# Patient Record
Sex: Female | Born: 1955 | Race: White | Hispanic: No | Marital: Married | Smoking: Former smoker
Health system: Southern US, Community
[De-identification: ages and names within clinical notes are randomized; demographics above are authoritative.]

## PROBLEM LIST (undated history)

## (undated) DIAGNOSIS — F419 Anxiety disorder, unspecified: Secondary | ICD-10-CM

## (undated) DIAGNOSIS — J45909 Unspecified asthma, uncomplicated: Secondary | ICD-10-CM

## (undated) DIAGNOSIS — E785 Hyperlipidemia, unspecified: Secondary | ICD-10-CM

## (undated) DIAGNOSIS — J449 Chronic obstructive pulmonary disease, unspecified: Secondary | ICD-10-CM

## (undated) DIAGNOSIS — E210 Primary hyperparathyroidism: Secondary | ICD-10-CM

## (undated) DIAGNOSIS — M7050 Other bursitis of knee, unspecified knee: Secondary | ICD-10-CM

## (undated) DIAGNOSIS — I10 Essential (primary) hypertension: Secondary | ICD-10-CM

## (undated) HISTORY — PX: ABDOMINAL HYSTERECTOMY: SHX81

---

## 1998-07-24 HISTORY — PX: SPLENECTOMY: SUR1306

## 1998-07-24 HISTORY — PX: CHOLECYSTECTOMY: SHX55

## 2004-06-21 ENCOUNTER — Ambulatory Visit: Payer: Self-pay | Admitting: Obstetrics and Gynecology

## 2005-11-10 ENCOUNTER — Ambulatory Visit: Payer: Self-pay | Admitting: Obstetrics and Gynecology

## 2005-11-15 ENCOUNTER — Ambulatory Visit: Payer: Self-pay | Admitting: Obstetrics and Gynecology

## 2005-12-22 ENCOUNTER — Emergency Department: Payer: Self-pay | Admitting: Emergency Medicine

## 2006-07-27 ENCOUNTER — Ambulatory Visit: Payer: Self-pay | Admitting: Obstetrics and Gynecology

## 2007-07-04 ENCOUNTER — Ambulatory Visit: Payer: Self-pay | Admitting: Obstetrics and Gynecology

## 2008-04-05 ENCOUNTER — Emergency Department: Payer: Self-pay | Admitting: Unknown Physician Specialty

## 2008-07-31 ENCOUNTER — Ambulatory Visit: Payer: Self-pay | Admitting: Obstetrics and Gynecology

## 2008-09-09 ENCOUNTER — Ambulatory Visit: Payer: Self-pay | Admitting: Unknown Physician Specialty

## 2009-04-09 ENCOUNTER — Emergency Department: Payer: Self-pay | Admitting: Emergency Medicine

## 2009-08-20 ENCOUNTER — Ambulatory Visit: Payer: Self-pay | Admitting: Obstetrics and Gynecology

## 2010-10-11 ENCOUNTER — Ambulatory Visit: Payer: Self-pay | Admitting: Unknown Physician Specialty

## 2011-06-14 ENCOUNTER — Ambulatory Visit: Payer: Self-pay | Admitting: Unknown Physician Specialty

## 2011-07-03 ENCOUNTER — Ambulatory Visit: Payer: Self-pay | Admitting: Unknown Physician Specialty

## 2011-07-10 ENCOUNTER — Ambulatory Visit: Payer: Self-pay | Admitting: Unknown Physician Specialty

## 2011-08-04 ENCOUNTER — Ambulatory Visit: Payer: Self-pay | Admitting: Unknown Physician Specialty

## 2011-10-12 ENCOUNTER — Emergency Department: Payer: Self-pay | Admitting: Emergency Medicine

## 2011-10-12 LAB — COMPREHENSIVE METABOLIC PANEL
Albumin: 4.5 g/dL (ref 3.4–5.0)
Alkaline Phosphatase: 90 U/L (ref 50–136)
Anion Gap: 12 (ref 7–16)
Calcium, Total: 10.3 mg/dL — ABNORMAL HIGH (ref 8.5–10.1)
Chloride: 106 mmol/L (ref 98–107)
EGFR (African American): >60
EGFR (Non-African Amer.): >60
Glucose: 110 mg/dL — ABNORMAL HIGH (ref 65–99)
SGOT(AST): 23 U/L (ref 15–37)
SGPT (ALT): 43 U/L

## 2011-10-12 LAB — CBC
HCT: 43.6 % (ref 35.0–47.0)
MCHC: 33.3 g/dL (ref 32.0–36.0)
MCV: 100 fL (ref 80–100)
RDW: 14.6 % — ABNORMAL HIGH (ref 11.5–14.5)
WBC: 9.6 10*3/uL (ref 3.6–11.0)

## 2011-10-12 LAB — URINALYSIS, COMPLETE
Bacteria: NONE SEEN
Glucose,UR: NEGATIVE mg/dL (ref 0–75)
Nitrite: NEGATIVE
Protein: NEGATIVE
WBC UR: 3 /HPF (ref 0–5)

## 2015-07-07 ENCOUNTER — Other Ambulatory Visit: Payer: Self-pay | Admitting: Family Medicine

## 2015-07-07 DIAGNOSIS — Z1231 Encounter for screening mammogram for malignant neoplasm of breast: Secondary | ICD-10-CM

## 2015-07-08 ENCOUNTER — Ambulatory Visit
Admission: RE | Admit: 2015-07-08 | Discharge: 2015-07-08 | Disposition: A | Discharge status: Home / Self Care | Payer: BLUE CROSS/BLUE SHIELD | Source: Ambulatory Visit | Attending: Family Medicine | Admitting: Family Medicine

## 2015-07-08 DIAGNOSIS — Z1231 Encounter for screening mammogram for malignant neoplasm of breast: Secondary | ICD-10-CM | POA: Insufficient documentation

## 2016-12-25 ENCOUNTER — Other Ambulatory Visit: Payer: Self-pay | Admitting: Family Medicine

## 2016-12-25 DIAGNOSIS — Z1231 Encounter for screening mammogram for malignant neoplasm of breast: Secondary | ICD-10-CM

## 2017-01-01 ENCOUNTER — Ambulatory Visit
Admission: RE | Admit: 2017-01-01 | Discharge: 2017-01-01 | Disposition: A | Discharge status: Home / Self Care | Payer: BLUE CROSS/BLUE SHIELD | Source: Ambulatory Visit | Attending: Family Medicine | Admitting: Family Medicine

## 2017-01-01 DIAGNOSIS — Z1231 Encounter for screening mammogram for malignant neoplasm of breast: Secondary | ICD-10-CM | POA: Insufficient documentation

## 2017-01-02 ENCOUNTER — Encounter: Payer: Self-pay | Admitting: Obstetrics & Gynecology

## 2017-01-02 ENCOUNTER — Ambulatory Visit (INDEPENDENT_AMBULATORY_CARE_PROVIDER_SITE_OTHER): Payer: BLUE CROSS/BLUE SHIELD | Admitting: Obstetrics & Gynecology

## 2017-01-02 VITALS — BP 120/80 | HR 79 | Ht 67.0 in | Wt 197.0 lb

## 2017-01-02 DIAGNOSIS — N3000 Acute cystitis without hematuria: Secondary | ICD-10-CM | POA: Diagnosis not present

## 2017-01-02 DIAGNOSIS — R3915 Urgency of urination: Secondary | ICD-10-CM

## 2017-01-02 LAB — POCT URINALYSIS DIPSTICK
Bilirubin, UA: NEGATIVE
Glucose, UA: NEGATIVE
Ketones, UA: NEGATIVE
Nitrite, UA: NEGATIVE
PH UA: 6 (ref 5.0–8.0)
PROTEIN UA: NEGATIVE
RBC UA: NEGATIVE
Spec Grav, UA: 1.015 (ref 1.010–1.025)
Urobilinogen, UA: NEGATIVE E.U./dL — AB

## 2017-01-02 MED ORDER — SULFAMETHOXAZOLE-TRIMETHOPRIM 800-160 MG PO TABS: 1.0000 | ORAL_TABLET | Freq: Two times a day (BID) | ORAL | 1 refills | Status: DC

## 2017-01-02 NOTE — Patient Instructions (Signed)
Sulfamethoxazole; Trimethoprim, SMX-TMP tablets What is this medicine? SULFAMETHOXAZOLE; TRIMETHOPRIM or SMX-TMP (suhl fuh meth OK suh zohl; trye METH oh prim) is a combination of a sulfonamide antibiotic and a second antibiotic, trimethoprim. It is used to treat or prevent certain kinds of bacterial infections. It will not work for colds, flu, or other viral infections. This medicine may be used for other purposes; ask your health care provider or pharmacist if you have questions. COMMON BRAND NAME(S): Bacter-Aid DS, Bactrim, Bactrim DS, Septra, Septra DS What should I tell my health care provider before I take this medicine? They need to know if you have any of these conditions: -anemia -asthma -being treated with anticonvulsants -if you frequently drink alcohol containing drinks -kidney disease -liver disease -low level of folic acid or glucose-6-phosphate dehydrogenase -poor nutrition or malabsorption -porphyria -severe allergies -thyroid disorder -an unusual or allergic reaction to sulfamethoxazole, trimethoprim, sulfa drugs, other medicines, foods, dyes, or preservatives -pregnant or trying to get pregnant -breast-feeding How should I use this medicine? Take this medicine by mouth with a full glass of water. Follow the directions on the prescription label. Take your medicine at regular intervals. Do not take it more often than directed. Do not skip doses or stop your medicine early. Talk to your pediatrician regarding the use of this medicine in children. Special care may be needed. This medicine has been used in children as young as 2 months of age. Overdosage: If you think you have taken too much of this medicine contact a poison control center or emergency room at once. NOTE: This medicine is only for you. Do not share this medicine with others. What if I miss a dose? If you miss a dose, take it as soon as you can. If it is almost time for your next dose, take only that dose. Do  not take double or extra doses. What may interact with this medicine? Do not take this medicine with any of the following medications: -aminobenzoate potassium -dofetilide -metronidazole This medicine may also interact with the following medications: -ACE inhibitors like benazepril, enalapril, lisinopril, and ramipril -birth control pills -cyclosporine -digoxin -diuretics -indomethacin -medicines for diabetes -methenamine -methotrexate -phenytoin -potassium supplements -pyrimethamine -sulfinpyrazone -tricyclic antidepressants -warfarin This list may not describe all possible interactions. Give your health care provider a list of all the medicines, herbs, non-prescription drugs, or dietary supplements you use. Also tell them if you smoke, drink alcohol, or use illegal drugs. Some items may interact with your medicine. What should I watch for while using this medicine? Tell your doctor or health care professional if your symptoms do not improve. Drink several glasses of water a day to reduce the risk of kidney problems. Do not treat diarrhea with over the counter products. Contact your doctor if you have diarrhea that lasts more than 2 days or if it is severe and watery. This medicine can make you more sensitive to the sun. Keep out of the sun. If you cannot avoid being in the sun, wear protective clothing and use a sunscreen. Do not use sun lamps or tanning beds/booths. What side effects may I notice from receiving this medicine? Side effects that you should report to your doctor or health care professional as soon as possible: -allergic reactions like skin rash or hives, swelling of the face, lips, or tongue -breathing problems -fever or chills, sore throat -irregular heartbeat, chest pain -joint or muscle pain -pain or difficulty passing urine -red pinpoint spots on skin -redness, blistering, peeling or loosening of   the skin, including inside the mouth -unusual bleeding or  bruising -unusually weak or tired -yellowing of the eyes or skin Side effects that usually do not require medical attention (report to your doctor or health care professional if they continue or are bothersome): -diarrhea -dizziness -headache -loss of appetite -nausea, vomiting -nervousness This list may not describe all possible side effects. Call your doctor for medical advice about side effects. You may report side effects to FDA at 1-800-FDA-1088. Where should I keep my medicine? Keep out of the reach of children. Store at room temperature between 20 to 25 degrees C (68 to 77 degrees F). Protect from light. Throw away any unused medicine after the expiration date. NOTE: This sheet is a summary. It may not cover all possible information. If you have questions about this medicine, talk to your doctor, pharmacist, or health care provider.  2018 Elsevier/Gold Standard (2013-02-14 14:38:26)  

## 2017-01-02 NOTE — Progress Notes (Signed)
  HPI:      [name] is a 61 y.o. [phone] who LMP was No LMP recorded. Patient is postmenopausal., presents today for a problem visit.    Urinary Tract Infection: Patient complains of urgency . She has had symptoms for 1 day. Patient also complains of frequency. Patient denies back pain and fever, nocturia, incontnence, dysuria. Patient does not have a history of recurrent UTI.  Patient does not have a history of pyelonephritis. H/o stones  PMHx: She  has no past medical history on file. Also,  has a past surgical history that includes Abdominal hysterectomy., family history is not on file.,  reports that she has never smoked. She has never used smokeless tobacco. She reports that she does not drink alcohol or use drugs.  She has a current medication list which includes the following prescription(s): sulfamethoxazole-trimethoprim. Also, has No Known Allergies.  Review of Systems  All other systems reviewed and are negative.   Objective: BP 120/80   Pulse 79   Ht 5\' 7"  (1.702 m)   Wt 197 lb (89.4 kg)   BMI 30.85 kg/m  Physical Exam  Constitutional: She is oriented to person, place, and time. She appears well-developed and well-nourished. No distress.  Musculoskeletal: Normal range of motion.  Neurological: She is alert and oriented to person, place, and time.  Skin: Skin is warm and dry.  Psychiatric: She has a normal mood and affect.  Vitals reviewed.  Results for orders placed or performed in visit on 01/02/17  POCT urinalysis dipstick  Result Value Ref Range   Color, UA yellow    Clarity, UA clear    Glucose, UA neg    Bilirubin, UA neg    Ketones, UA neg    Spec Grav, UA 1.015 1.010 - 1.025   Blood, UA neg    pH, UA 6.0 5.0 - 8.0   Protein, UA neg    Urobilinogen, UA negative (A) 0.2 or 1.0 E.U./dL   Nitrite, UA neg    Leukocytes, UA Moderate (2+) (A) Negative    ASSESSMENT/PLAN:   Acute cystitis  Problem List Items Addressed This Visit      Other   Urinary urgency - Primary   Relevant Orders   POCT urinalysis dipstick (Completed)   Urine Culture    Other Visit Diagnoses    Acute cystitis without hematuria       Relevant Orders   Urine Culture    Bactrim  Barnett Applebaum, MD, [facility], [facility] 01/02/2017  2:34 PM

## 2017-01-04 LAB — URINE CULTURE

## 2018-04-02 ENCOUNTER — Encounter: Payer: Self-pay | Admitting: Internal Medicine

## 2018-04-02 ENCOUNTER — Inpatient Hospital Stay: Payer: BLUE CROSS/BLUE SHIELD | Attending: Internal Medicine | Admitting: Internal Medicine

## 2018-04-02 DIAGNOSIS — D75839 Thrombocytosis, unspecified: Secondary | ICD-10-CM | POA: Insufficient documentation

## 2018-04-02 DIAGNOSIS — Z7901 Long term (current) use of anticoagulants: Secondary | ICD-10-CM | POA: Diagnosis not present

## 2018-04-02 DIAGNOSIS — D473 Essential (hemorrhagic) thrombocythemia: Secondary | ICD-10-CM | POA: Diagnosis not present

## 2018-04-02 DIAGNOSIS — I824Z3 Acute embolism and thrombosis of unspecified deep veins of distal lower extremity, bilateral: Secondary | ICD-10-CM | POA: Diagnosis not present

## 2018-04-02 DIAGNOSIS — Z9081 Acquired absence of spleen: Secondary | ICD-10-CM | POA: Insufficient documentation

## 2018-04-02 DIAGNOSIS — Z87891 Personal history of nicotine dependence: Secondary | ICD-10-CM

## 2018-04-02 DIAGNOSIS — Z862 Personal history of diseases of the blood and blood-forming organs and certain disorders involving the immune mechanism: Secondary | ICD-10-CM | POA: Diagnosis not present

## 2018-04-02 DIAGNOSIS — Z993 Dependence on wheelchair: Secondary | ICD-10-CM | POA: Insufficient documentation

## 2018-04-02 MED ORDER — RIVAROXABAN 20 MG PO TABS: 20.0000 mg | ORAL_TABLET | Freq: Every day | ORAL | 0 refills | Status: DC
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
## 2018-04-19 ENCOUNTER — Ambulatory Visit
Admission: EM | Admit: 2018-04-19 | Discharge: 2018-04-19 | Disposition: A | Discharge status: Home / Self Care | Payer: BLUE CROSS/BLUE SHIELD | Attending: Family Medicine | Admitting: Family Medicine

## 2018-04-19 ENCOUNTER — Other Ambulatory Visit: Payer: Self-pay

## 2018-04-19 DIAGNOSIS — T8189XA Other complications of procedures, not elsewhere classified, initial encounter: Secondary | ICD-10-CM | POA: Diagnosis not present

## 2018-04-19 DIAGNOSIS — T148XXD Other injury of unspecified body region, subsequent encounter: Secondary | ICD-10-CM | POA: Diagnosis not present

## 2018-04-19 DIAGNOSIS — L089 Local infection of the skin and subcutaneous tissue, unspecified: Secondary | ICD-10-CM

## 2018-04-19 DIAGNOSIS — T148XXA Other injury of unspecified body region, initial encounter: Principal | ICD-10-CM

## 2018-04-19 DIAGNOSIS — Z189 Retained foreign body fragments, unspecified material: Secondary | ICD-10-CM

## 2018-04-19 DIAGNOSIS — Z1889 Other specified retained foreign body fragments: Secondary | ICD-10-CM

## 2018-04-19 MED ORDER — DOXYCYCLINE HYCLATE 100 MG PO CAPS: 100.0000 mg | ORAL_CAPSULE | Freq: Two times a day (BID) | ORAL | 0 refills | Status: AC
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
## 2018-04-19 NOTE — Discharge Instructions (Signed)
Call Surgeon to let him know.  Medication as prescribed.  Take care  Dr. Lacinda Axon
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
## 2018-05-30 ENCOUNTER — Other Ambulatory Visit: Payer: Self-pay | Admitting: Internal Medicine

## 2018-06-04 ENCOUNTER — Ambulatory Visit: Payer: BLUE CROSS/BLUE SHIELD | Admitting: Internal Medicine

## 2018-06-04 ENCOUNTER — Other Ambulatory Visit: Payer: BLUE CROSS/BLUE SHIELD

## 2018-09-05 ENCOUNTER — Other Ambulatory Visit: Payer: Self-pay | Admitting: Family Medicine

## 2018-09-05 DIAGNOSIS — Z1231 Encounter for screening mammogram for malignant neoplasm of breast: Secondary | ICD-10-CM

## 2018-09-25 ENCOUNTER — Ambulatory Visit
Admission: RE | Admit: 2018-09-25 | Discharge: 2018-09-25 | Disposition: A | Discharge status: Home / Self Care | Payer: BLUE CROSS/BLUE SHIELD | Source: Ambulatory Visit | Attending: Family Medicine | Admitting: Family Medicine

## 2018-09-25 DIAGNOSIS — Z1231 Encounter for screening mammogram for malignant neoplasm of breast: Secondary | ICD-10-CM | POA: Insufficient documentation

## 2018-09-30 ENCOUNTER — Other Ambulatory Visit: Payer: Self-pay | Admitting: Family Medicine

## 2018-09-30 DIAGNOSIS — R928 Other abnormal and inconclusive findings on diagnostic imaging of breast: Secondary | ICD-10-CM

## 2018-09-30 DIAGNOSIS — N6310 Unspecified lump in the right breast, unspecified quadrant: Secondary | ICD-10-CM

## 2018-10-07 ENCOUNTER — Other Ambulatory Visit: Payer: Self-pay

## 2018-10-07 ENCOUNTER — Ambulatory Visit
Admission: RE | Admit: 2018-10-07 | Discharge: 2018-10-07 | Disposition: A | Discharge status: Home / Self Care | Payer: BLUE CROSS/BLUE SHIELD | Source: Ambulatory Visit | Attending: Family Medicine | Admitting: Family Medicine

## 2018-10-07 DIAGNOSIS — R928 Other abnormal and inconclusive findings on diagnostic imaging of breast: Secondary | ICD-10-CM

## 2018-10-07 DIAGNOSIS — N6310 Unspecified lump in the right breast, unspecified quadrant: Secondary | ICD-10-CM | POA: Insufficient documentation

## 2019-06-10 ENCOUNTER — Other Ambulatory Visit
Admission: RE | Admit: 2019-06-10 | Discharge: 2019-06-10 | Disposition: A | Discharge status: Home / Self Care | Payer: BLUE CROSS/BLUE SHIELD | Source: Ambulatory Visit | Attending: General Surgery | Admitting: General Surgery

## 2019-06-10 ENCOUNTER — Other Ambulatory Visit: Payer: Self-pay

## 2019-06-10 DIAGNOSIS — Z01812 Encounter for preprocedural laboratory examination: Secondary | ICD-10-CM | POA: Diagnosis not present

## 2019-06-10 DIAGNOSIS — Z20828 Contact with and (suspected) exposure to other viral communicable diseases: Secondary | ICD-10-CM | POA: Insufficient documentation

## 2019-06-10 LAB — SARS CORONAVIRUS 2 (TAT 6-24 HRS): SARS Coronavirus 2: NEGATIVE

## 2019-06-11 ENCOUNTER — Encounter: Payer: Self-pay | Admitting: *Deleted

## 2019-06-12 ENCOUNTER — Encounter: Payer: Self-pay | Admitting: *Deleted

## 2019-06-12 ENCOUNTER — Ambulatory Visit: Payer: BLUE CROSS/BLUE SHIELD | Admitting: Certified Registered Nurse Anesthetist

## 2019-06-12 ENCOUNTER — Ambulatory Visit
Admission: RE | Admit: 2019-06-12 | Discharge: 2019-06-12 | Disposition: A | Discharge status: Home / Self Care | Payer: BLUE CROSS/BLUE SHIELD | Attending: Internal Medicine | Admitting: Internal Medicine

## 2019-06-12 ENCOUNTER — Encounter
Admission: RE | Disposition: A | Discharge status: Home / Self Care | Payer: Self-pay | Source: Home / Self Care | Attending: Internal Medicine

## 2019-06-12 ENCOUNTER — Other Ambulatory Visit: Payer: Self-pay

## 2019-06-12 DIAGNOSIS — J449 Chronic obstructive pulmonary disease, unspecified: Secondary | ICD-10-CM | POA: Insufficient documentation

## 2019-06-12 DIAGNOSIS — Z1211 Encounter for screening for malignant neoplasm of colon: Secondary | ICD-10-CM | POA: Insufficient documentation

## 2019-06-12 DIAGNOSIS — Z885 Allergy status to narcotic agent status: Secondary | ICD-10-CM | POA: Diagnosis not present

## 2019-06-12 DIAGNOSIS — E785 Hyperlipidemia, unspecified: Secondary | ICD-10-CM | POA: Insufficient documentation

## 2019-06-12 DIAGNOSIS — I10 Essential (primary) hypertension: Secondary | ICD-10-CM | POA: Insufficient documentation

## 2019-06-12 DIAGNOSIS — K5730 Diverticulosis of large intestine without perforation or abscess without bleeding: Secondary | ICD-10-CM | POA: Insufficient documentation

## 2019-06-12 DIAGNOSIS — K640 First degree hemorrhoids: Secondary | ICD-10-CM | POA: Diagnosis not present

## 2019-06-12 DIAGNOSIS — Z86010 Personal history of colonic polyps: Secondary | ICD-10-CM | POA: Diagnosis not present

## 2019-06-12 DIAGNOSIS — Z7951 Long term (current) use of inhaled steroids: Secondary | ICD-10-CM | POA: Insufficient documentation

## 2019-06-12 DIAGNOSIS — Z79899 Other long term (current) drug therapy: Secondary | ICD-10-CM | POA: Diagnosis not present

## 2019-06-12 HISTORY — DX: Anxiety disorder, unspecified: F41.9

## 2019-06-12 HISTORY — DX: Chronic obstructive pulmonary disease, unspecified: J44.9

## 2019-06-12 HISTORY — DX: Essential (primary) hypertension: I10

## 2019-06-12 HISTORY — DX: Primary hyperparathyroidism: E21.0

## 2019-06-12 HISTORY — DX: Other bursitis of knee, unspecified knee: M70.50

## 2019-06-12 HISTORY — DX: Unspecified asthma, uncomplicated: J45.909

## 2019-06-12 HISTORY — DX: Hyperlipidemia, unspecified: E78.5

## 2019-06-12 HISTORY — PX: COLONOSCOPY WITH PROPOFOL: SHX5780

## 2019-06-12 SURGERY — COLONOSCOPY WITH PROPOFOL
Anesthesia: General

## 2019-06-12 MED ORDER — PROPOFOL 10 MG/ML IV BOLUS
INTRAVENOUS | Status: DC | PRN
  Administered 2019-06-12: 60 mg via INTRAVENOUS

## 2019-06-12 MED ORDER — LIDOCAINE HCL (CARDIAC) PF 100 MG/5ML IV SOSY
PREFILLED_SYRINGE | INTRAVENOUS | Status: DC | PRN
  Administered 2019-06-12: 100 mg via INTRAVENOUS

## 2019-06-12 MED ORDER — SODIUM CHLORIDE 0.9 % IV SOLN
INTRAVENOUS | Status: DC
  Administered 2019-06-12: 1000 mL via INTRAVENOUS

## 2019-06-12 MED ORDER — PROPOFOL 500 MG/50ML IV EMUL
INTRAVENOUS | Status: DC | PRN
  Administered 2019-06-12: 125 ug/kg/min via INTRAVENOUS

## 2019-06-12 NOTE — Op Note (Signed)
[facility] Gastroenterology Patient Name: Kellie Myers Procedure Date: 06/12/2019 11:03 AM MRN: [id number] Account #: [id number] Date of Birth: 15-May-1956 Admit Type: Outpatient Age: 63 Room: [facility] ENDO ROOM 3 Gender: Female Note Status: Finalized Procedure:             Colonoscopy Indications:           High risk colon cancer surveillance: Personal history                         of colonic polyps Providers:             Benay Pike. Dany Walther MD, MD Medicines:             Propofol per Anesthesia Complications:         No immediate complications. Procedure:             Pre-Anesthesia Assessment:                        - The risks and benefits of the procedure and the                         sedation options and risks were discussed with the                         patient. All questions were answered and informed                         consent was obtained.                        - Patient identification and proposed procedure were                         verified prior to the procedure by the nurse. The                         procedure was verified in the procedure room.                        - ASA Grade Assessment: III - A patient with severe                         systemic disease.                        - After reviewing the risks and benefits, the patient                         was deemed in satisfactory condition to undergo the                         procedure.                        After obtaining informed consent, the colonoscope was                         passed under direct vision. Throughout the procedure,  the patient's blood pressure, pulse, and oxygen                         saturations were monitored continuously. The                         Colonoscope was introduced through the anus and                         advanced to the the cecum, identified by appendiceal                         orifice and ileocecal valve. The  colonoscopy was                         performed without difficulty. The patient tolerated                         the procedure well. The quality of the bowel                         preparation was good. The ileocecal valve, appendiceal                         orifice, and rectum were photographed. Findings:      The perianal and digital rectal examinations were normal. Pertinent       negatives include normal sphincter tone and no palpable rectal lesions.      Many small-mouthed diverticula were found in the entire colon.      Non-bleeding internal hemorrhoids were found during retroflexion. The       hemorrhoids were Grade I (internal hemorrhoids that do not prolapse).      The exam was otherwise without abnormality. Impression:            - Diverticulosis in the entire examined colon.                        - Non-bleeding internal hemorrhoids.                        - The examination was otherwise normal.                        - No specimens collected. Recommendation:        - Patient has a contact number available for                         emergencies. The signs and symptoms of potential                         delayed complications were discussed with the patient.                         Return to normal activities tomorrow. Written                         discharge instructions were provided to the patient.                        -  Resume previous diet.                        - Continue present medications.                        - Repeat colonoscopy in 10 years for screening                         purposes.                        - Return to GI office PRN.                        - The findings and recommendations were discussed with                         the patient. Procedure Code(s):     --- Professional ---                        [id number], Colorectal cancer screening; colonoscopy on                         individual at high risk Diagnosis Code(s):     --- Professional  ---                        K57.30, Diverticulosis of large intestine without                         perforation or abscess without bleeding                        K64.0, First degree hemorrhoids CPT copyright 2019 American Medical Association. All rights reserved. The codes documented in this report are preliminary and upon coder review may  be revised to meet current compliance requirements. Efrain Sella MD, MD 06/12/2019 11:45:45 AM This report has been signed electronically. Number of Addenda: 0 Note Initiated On: 06/12/2019 11:03 AM Scope Withdrawal Time: 0 hours 8 minutes 41 seconds  Total Procedure Duration: 0 hours 18 minutes 23 seconds  Estimated Blood Loss:  Estimated blood loss: none.      [facility]

## 2019-06-12 NOTE — Anesthesia Preprocedure Evaluation (Signed)
Anesthesia Evaluation  Patient identified by MRN, date of birth, ID band Patient awake    Reviewed: Allergy & Precautions, H&P , NPO status , Patient's Chart, lab work & pertinent test results, reviewed documented beta blocker date and time   History of Anesthesia Complications Negative for: history of anesthetic complications  Airway Mallampati: II  TM Distance: >3 FB Neck ROM: full    Dental  (+) Dental Advidsory Given, Teeth Intact, Edentulous Upper, Upper Dentures, Caps   Pulmonary neg shortness of breath, asthma , COPD,  COPD inhaler, neg recent URI, former smoker,    Pulmonary exam normal        Cardiovascular Exercise Tolerance: Good negative cardio ROS Normal cardiovascular exam     Neuro/Psych PSYCHIATRIC DISORDERS Anxiety negative neurological ROS     GI/Hepatic negative GI ROS, Neg liver ROS,   Endo/Other  negative endocrine ROS  Renal/GU negative Renal ROS  negative genitourinary   Musculoskeletal   Abdominal   Peds  Hematology negative hematology ROS (+)   Anesthesia Other Findings Past Medical History: No date: Anxiety No date: Asthma No date: COPD (chronic obstructive pulmonary disease) (HCC) No date: Hyperlipidemia No date: Hypertension No date: Pes anserine bursitis No date: Primary hyperparathyroidism (HCC)   Reproductive/Obstetrics negative OB ROS                             Anesthesia Physical Anesthesia Plan  ASA: II  Anesthesia Plan: General   Post-op Pain Management:    Induction: Intravenous  PONV Risk Score and Plan: 3 and Propofol infusion and TIVA  Airway Management Planned: Natural Airway and Nasal Cannula  Additional Equipment:   Intra-op Plan:   Post-operative Plan:   Informed Consent: I have reviewed the patients History and Physical, chart, labs and discussed the procedure including the risks, benefits and alternatives for the proposed  anesthesia with the patient or authorized representative who has indicated his/her understanding and acceptance.     Dental Advisory Given  Plan Discussed with: Anesthesiologist, CRNA and Surgeon  Anesthesia Plan Comments:         Anesthesia Quick Evaluation

## 2019-06-12 NOTE — Anesthesia Postprocedure Evaluation (Signed)
Anesthesia Post Note  Patient: Kellie Myers  Procedure(s) Performed: COLONOSCOPY WITH PROPOFOL (N/A )  Patient location during evaluation: Endoscopy Anesthesia Type: General Level of consciousness: awake and alert Pain management: pain level controlled Vital Signs Assessment: post-procedure vital signs reviewed and stable Respiratory status: spontaneous breathing, nonlabored ventilation, respiratory function stable and patient connected to nasal cannula oxygen Cardiovascular status: blood pressure returned to baseline and stable Postop Assessment: no apparent nausea or vomiting Anesthetic complications: no     Last Vitals:  Vitals:   06/12/19 1206 06/12/19 1216  BP: 121/72 122/77  Pulse: 74 71  Resp: 17 11  Temp:    SpO2: 100% 100%    Last Pain:  Vitals:   06/12/19 1216  TempSrc:   PainSc: 0-No pain                 Martha Clan

## 2019-06-12 NOTE — Interval H&P Note (Signed)
History and Physical Interval Note:  06/12/2019 11:01 AM  Kellie Myers  has presented today for surgery, with the diagnosis of COLON CANCER SCREENING.  The various methods of treatment have been discussed with the patient and family. After consideration of risks, benefits and other options for treatment, the patient has consented to  Procedure(s): COLONOSCOPY WITH PROPOFOL (N/A) as a surgical intervention.  The patient's history has been reviewed, patient examined, no change in status, stable for surgery.  I have reviewed the patient's chart and labs.  Questions were answered to the patient's satisfaction.     [place], [place]

## 2019-06-12 NOTE — H&P (Signed)
Outpatient short stay form Pre-procedure 06/12/2019 11:01 AM  K. Alice Reichert, M.D.  Primary Physician: Thereasa Distance, M.D.  Reason for visit:  Colon cancer screening  History of present illness:  Patient presents for colonoscopy for colon cancer screening. The patient denies complaints of abdominal pain, significant change in bowel habits, or rectal bleeding.     Current Facility-Administered Medications:  .  0.9 %  sodium chloride infusion, , Intravenous, Continuous, [place], Benay Pike, MD, Last Rate: 20 mL/hr at 06/12/19 1055, 1,000 mL at 06/12/19 1055  Medications Prior to Admission  Medication Sig Dispense Refill Last Dose  . albuterol (VENTOLIN HFA) 108 (90 Base) MCG/ACT inhaler Inhale 2 puffs into the lungs every 6 (six) hours as needed for wheezing or shortness of breath.     Marland Kitchen b complex vitamins tablet Take 1 tablet by mouth daily.     . fluticasone furoate-vilanterol (BREO ELLIPTA) 200-25 MCG/INH AEPB inhale 1 puff once daily   06/12/2019 at Unknown time  . calcium carbonate (OS-CAL - DOSED IN MG OF ELEMENTAL CALCIUM) 1250 (500 Ca) MG tablet Take 1 tablet by mouth.     . cholecalciferol (VITAMIN D) 1000 units tablet Take 1,000 Units by mouth daily.     . cyclobenzaprine (FLEXERIL) 10 MG tablet   3   . doxycycline (VIBRAMYCIN) 100 MG capsule Take 1 capsule (100 mg total) by mouth 2 (two) times daily. 14 capsule 0   . magnesium oxide (MAG-OX) 400 MG tablet Take by mouth.     . Omega-3 1000 MG CAPS Take by mouth.        Allergies  Allergen Reactions  . Morphine Itching  . Ketamine Other (See Comments)    Significant delirium, agitation, combativeness and hallucinations after ketamine infusion intra-op Significant delirium, agitation, combativeness and hallucinations after ketamine infusion intra-op      Past Medical History:  Diagnosis Date  . Anxiety   . Asthma   . COPD (chronic obstructive pulmonary disease) ([place])   . Hyperlipidemia   . Hypertension   . Pes  anserine bursitis   . Primary hyperparathyroidism ([place])     Review of systems:  Otherwise negative.    Physical Exam  Gen: Alert, oriented. Appears stated age.  HEENT: [place]/AT. PERRLA. Lungs: CTA, no wheezes. CV: RR nl S1, S2. Abd: soft, benign, no masses. BS+ Ext: No edema. Pulses 2+    Planned procedures: Proceed with colonoscopy. The patient understands the nature of the planned procedure, indications, risks, alternatives and potential complications including but not limited to bleeding, infection, perforation, damage to internal organs and possible oversedation/side effects from anesthesia. The patient agrees and gives consent to proceed.  Please refer to procedure notes for findings, recommendations and patient disposition/instructions.      K. Alice Reichert, M.D. Gastroenterology 06/12/2019  11:01 AM

## 2019-06-12 NOTE — Anesthesia Post-op Follow-up Note (Signed)
Anesthesia QCDR form completed.        

## 2019-06-12 NOTE — Transfer of Care (Signed)
Immediate Anesthesia Transfer of Care Note  Patient: Kellie Myers  Procedure(s) Performed: COLONOSCOPY WITH PROPOFOL (N/A )  Patient Location: PACU and Endoscopy Unit  Anesthesia Type:General  Level of Consciousness: awake, drowsy and patient cooperative  Airway & Oxygen Therapy: Patient Spontanous Breathing  Post-op Assessment: Report given to RN and Post -op Vital signs reviewed and stable  Post vital signs: Reviewed and stable  Last Vitals:  Vitals Value Taken Time  BP 116/78 06/12/19 1146  Temp 36.7 C 06/12/19 1146  Pulse 83 06/12/19 1148  Resp 26 06/12/19 1148  SpO2 100 % 06/12/19 1148  Vitals shown include unvalidated device data.  Last Pain:  Vitals:   06/12/19 1146  TempSrc:   PainSc: Asleep         Complications: No apparent anesthesia complications

## 2019-06-13 ENCOUNTER — Encounter: Payer: Self-pay | Admitting: Internal Medicine

## 2019-08-10 ENCOUNTER — Other Ambulatory Visit: Payer: Self-pay

## 2019-08-10 ENCOUNTER — Ambulatory Visit
Admission: EM | Admit: 2019-08-10 | Discharge: 2019-08-10 | Disposition: A | Discharge status: Home / Self Care | Payer: BLUE CROSS/BLUE SHIELD | Attending: Family Medicine | Admitting: Family Medicine

## 2019-08-10 DIAGNOSIS — Z885 Allergy status to narcotic agent status: Secondary | ICD-10-CM | POA: Diagnosis not present

## 2019-08-10 DIAGNOSIS — R7989 Other specified abnormal findings of blood chemistry: Secondary | ICD-10-CM | POA: Diagnosis not present

## 2019-08-10 DIAGNOSIS — I10 Essential (primary) hypertension: Secondary | ICD-10-CM | POA: Diagnosis not present

## 2019-08-10 DIAGNOSIS — Z7951 Long term (current) use of inhaled steroids: Secondary | ICD-10-CM | POA: Diagnosis not present

## 2019-08-10 DIAGNOSIS — Z9049 Acquired absence of other specified parts of digestive tract: Secondary | ICD-10-CM | POA: Diagnosis not present

## 2019-08-10 DIAGNOSIS — Z9081 Acquired absence of spleen: Secondary | ICD-10-CM | POA: Insufficient documentation

## 2019-08-10 DIAGNOSIS — Z90710 Acquired absence of both cervix and uterus: Secondary | ICD-10-CM | POA: Diagnosis not present

## 2019-08-10 DIAGNOSIS — E785 Hyperlipidemia, unspecified: Secondary | ICD-10-CM | POA: Diagnosis not present

## 2019-08-10 DIAGNOSIS — Z87891 Personal history of nicotine dependence: Secondary | ICD-10-CM

## 2019-08-10 DIAGNOSIS — Z79899 Other long term (current) drug therapy: Secondary | ICD-10-CM | POA: Diagnosis not present

## 2019-08-10 DIAGNOSIS — J069 Acute upper respiratory infection, unspecified: Secondary | ICD-10-CM | POA: Diagnosis not present

## 2019-08-10 DIAGNOSIS — J449 Chronic obstructive pulmonary disease, unspecified: Secondary | ICD-10-CM | POA: Diagnosis not present

## 2019-08-10 DIAGNOSIS — Z20822 Contact with and (suspected) exposure to covid-19: Secondary | ICD-10-CM | POA: Insufficient documentation

## 2019-08-10 NOTE — ED Triage Notes (Signed)
Pt presents with c/o fatigue, dizziness, nausea, sore throat, dry cough, nasal congestion and overall not feeling well. She reports these symptoms started yesterday. She reports no known sick contacts. She denies fever but does think she may have had some chills.

## 2019-08-10 NOTE — Discharge Instructions (Signed)
Rest, fluids, over the counter medications as needed  

## 2019-08-12 LAB — NOVEL CORONAVIRUS, NAA (HOSP ORDER, SEND-OUT TO REF LAB; TAT 18-24 HRS): SARS-CoV-2, NAA: NOT DETECTED

## 2019-08-13 NOTE — ED Provider Notes (Signed)
[facility]    CSN: [id number] Arrival date & time: 08/10/19  1136      History   Chief Complaint Chief Complaint  Patient presents with  . Fatigue  . Cough    HPI Kellie Myers is a 64 y.o. female.   64 yo female with a c/o fatigue, sore throat, dizziness, cough, nasal congestion since yesterday. Denies any fevers but has had chills. No known sick contacts.    Cough   Past Medical History:  Diagnosis Date  . Anxiety   . Asthma   . COPD (chronic obstructive pulmonary disease) ([place])   . Hyperlipidemia   . Hypertension   . Pes anserine bursitis   . Primary hyperparathyroidism [facility])     Patient Active Problem List   Diagnosis Date Noted  . Thrombocytosis ([place]) 04/02/2018  . Urinary urgency 01/02/2017    Past Surgical History:  Procedure Laterality Date  . ABDOMINAL HYSTERECTOMY    . BRAIN SURGERY    . CHOLECYSTECTOMY  2000  . COLONOSCOPY WITH PROPOFOL N/A 06/12/2019   Procedure: COLONOSCOPY WITH PROPOFOL;  Surgeon: [place], Benay Pike, MD;  Location: [facility] ENDOSCOPY;  Service: Endoscopy;  Laterality: N/A;  . HERNIA REPAIR    . KNEE ARTHROSCOPY    . POPLITEAL SYNOVIAL CYST EXCISION    . REPAIR NONUNION / MALUNION FEMUR    . RETINAL DETACHMENT SURGERY    . SPLENECTOMY  2000  . TONSILLECTOMY      OB History    Gravida  3   Para  3   Term  3   Preterm      AB      Living  3     SAB      TAB      Ectopic      Multiple      Live Births               Home Medications    Prior to Admission medications   Medication Sig Start Date End Date Taking? Authorizing Provider  albuterol (VENTOLIN HFA) 108 (90 Base) MCG/ACT inhaler Inhale 2 puffs into the lungs every 6 (six) hours as needed for wheezing or shortness of breath.   Yes [provider]  b complex vitamins tablet Take 1 tablet by mouth daily.   Yes [provider]  calcium carbonate (OS-CAL - DOSED IN MG OF ELEMENTAL CALCIUM) 1250 (500 Ca) MG tablet Take 1  tablet by mouth.   Yes [provider]  cholecalciferol (VITAMIN D) 1000 units tablet Take 1,000 Units by mouth daily.   Yes [provider]  cyclobenzaprine (FLEXERIL) 10 MG tablet  03/29/18  Yes [provider]  fluticasone furoate-vilanterol (BREO ELLIPTA) 200-25 MCG/INH AEPB inhale 1 puff once daily 12/21/14  Yes [provider]  magnesium oxide (MAG-OX) 400 MG tablet Take by mouth.   Yes [provider]  Omega-3 1000 MG CAPS Take by mouth.   Yes [provider]  doxycycline (VIBRAMYCIN) 100 MG capsule Take 1 capsule (100 mg total) by mouth 2 (two) times daily. 04/19/18   Coral Spikes, DO    Family History History reviewed. No pertinent family history.  Social History Social History   Tobacco Use  . Smoking status: Former Smoker    Types: Cigarettes    Quit date: 03/24/1998    Years since quitting: 21.4  . Smokeless tobacco: Never Used  Substance Use Topics  . Alcohol use: Yes    Comment: "social"  .  Drug use: No     Allergies   Morphine and Ketamine   Review of Systems Review of Systems  Respiratory: Positive for cough.      Physical Exam Triage Vital Signs ED Triage Vitals  Enc Vitals Group     BP 08/10/19 1156 134/74     Pulse Rate 08/10/19 1156 (!) 107     Resp --      Temp 08/10/19 1156 98.2 F (36.8 C)     Temp Source 08/10/19 1156 Oral     SpO2 08/10/19 1156 96 %     Weight 08/10/19 1154 185 lb (83.9 kg)     Height 08/10/19 1154 5\' 7"  (1.702 m)     Head Circumference --      Peak Flow --      Pain Score 08/10/19 1153 0     Pain Loc --      Pain Edu? --      Excl. in [place]? --    No data found.  Updated Vital Signs BP 134/74 (BP Location: Left Arm)   Pulse (!) 107   Temp 98.2 F (36.8 C) (Oral)   Ht 5\' 7"  (1.702 m)   Wt 83.9 kg   SpO2 96%   BMI 28.98 kg/m   Visual Acuity Right Eye Distance:   Left Eye Distance:   Bilateral Distance:    Right Eye Near:   Left Eye Near:    Bilateral  Near:     Physical Exam Vitals and nursing note reviewed.  Constitutional:      General: She is not in acute distress.    Appearance: She is not toxic-appearing or diaphoretic.  Cardiovascular:     Rate and Rhythm: Normal rate.     Heart sounds: Normal heart sounds.  Pulmonary:     Effort: Pulmonary effort is normal. No respiratory distress.     Breath sounds: Normal breath sounds.  Neurological:     Mental Status: She is alert.      UC Treatments / Results  Labs (all labs ordered are listed, but only abnormal results are displayed) Labs Reviewed  NOVEL CORONAVIRUS, NAA (HOSP ORDER, SEND-OUT TO REF LAB; TAT 18-24 HRS)    EKG   Radiology No results found.  Procedures Procedures (including critical care time)  Medications Ordered in UC Medications - No data to display  Initial Impression / Assessment and Plan / UC Course  I have reviewed the triage vital signs and the nursing notes.  Pertinent labs & imaging results that were available during my care of the patient were reviewed by me and considered in my medical decision making (see chart for details).      Final Clinical Impressions(s) / UC Diagnoses   Final diagnoses:  Viral URI with cough     Discharge Instructions     Rest, fluids, over the counter medications as needed    ED Prescriptions    None      1. diagnosis reviewed with patient 2. Recommend supportive treatment as above  3. Follow-up prn if symptoms worsen or don't improve   PDMP not reviewed this encounter.   Norval Gable, MD 08/13/19 [phone]

## 2020-01-20 ENCOUNTER — Other Ambulatory Visit: Payer: Self-pay | Admitting: Family Medicine

## 2020-01-20 ENCOUNTER — Other Ambulatory Visit: Payer: Self-pay | Admitting: Gerontology

## 2020-01-20 DIAGNOSIS — Z1231 Encounter for screening mammogram for malignant neoplasm of breast: Secondary | ICD-10-CM

## 2020-01-22 ENCOUNTER — Ambulatory Visit: Admission: RE | Admit: 2020-01-22 | Payer: BLUE CROSS/BLUE SHIELD | Source: Ambulatory Visit

## 2020-01-22 ENCOUNTER — Other Ambulatory Visit: Payer: Self-pay
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
